# Patient Record
Sex: Female | Born: 1965 | Race: Black or African American | Hispanic: No | Smoking: Never smoker
Health system: Southern US, Community
[De-identification: ages and names within clinical notes are randomized; demographics above are authoritative.]

## PROBLEM LIST (undated history)

## (undated) DIAGNOSIS — D649 Anemia, unspecified: Secondary | ICD-10-CM

## (undated) DIAGNOSIS — R011 Cardiac murmur, unspecified: Secondary | ICD-10-CM

## (undated) DIAGNOSIS — E119 Type 2 diabetes mellitus without complications: Secondary | ICD-10-CM

## (undated) DIAGNOSIS — I10 Essential (primary) hypertension: Secondary | ICD-10-CM

## (undated) DIAGNOSIS — M1990 Unspecified osteoarthritis, unspecified site: Secondary | ICD-10-CM

## (undated) DIAGNOSIS — Z5189 Encounter for other specified aftercare: Secondary | ICD-10-CM

## (undated) HISTORY — DX: Type 2 diabetes mellitus without complications: E11.9

## (undated) HISTORY — DX: Encounter for other specified aftercare: Z51.89

## (undated) HISTORY — PX: BUNIONECTOMY: SHX129

## (undated) HISTORY — PX: OTHER SURGICAL HISTORY: SHX169

## (undated) HISTORY — DX: Anemia, unspecified: D64.9

## (undated) HISTORY — PX: OSTEOTOMY: SHX137

## (undated) HISTORY — DX: Essential (primary) hypertension: I10

## (undated) HISTORY — DX: Cardiac murmur, unspecified: R01.1

## (undated) HISTORY — DX: Unspecified osteoarthritis, unspecified site: M19.90

---

## 2013-10-15 HISTORY — PX: ELBOW SURGERY: SHX618

## 2017-06-29 ENCOUNTER — Encounter: Payer: Self-pay | Admitting: Gastroenterology

## 2017-07-10 ENCOUNTER — Encounter: Payer: Self-pay | Admitting: Gastroenterology

## 2017-07-18 ENCOUNTER — Other Ambulatory Visit: Payer: Self-pay | Admitting: Specialist

## 2017-07-18 DIAGNOSIS — Z1231 Encounter for screening mammogram for malignant neoplasm of breast: Secondary | ICD-10-CM

## 2017-07-24 ENCOUNTER — Ambulatory Visit
Admission: RE | Admit: 2017-07-24 | Discharge: 2017-07-24 | Disposition: A | Discharge status: Home / Self Care | Payer: BLUE CROSS/BLUE SHIELD | Source: Ambulatory Visit | Attending: Specialist | Admitting: Specialist

## 2017-07-24 DIAGNOSIS — Z1231 Encounter for screening mammogram for malignant neoplasm of breast: Secondary | ICD-10-CM

## 2017-08-06 ENCOUNTER — Telehealth: Payer: Self-pay | Admitting: Gastroenterology

## 2017-08-06 ENCOUNTER — Ambulatory Visit (AMBULATORY_SURGERY_CENTER): Payer: Self-pay

## 2017-08-06 ENCOUNTER — Other Ambulatory Visit: Payer: Self-pay

## 2017-08-06 ENCOUNTER — Encounter: Payer: Self-pay | Admitting: Gastroenterology

## 2017-08-06 VITALS — Ht 63.0 in | Wt 189.8 lb

## 2017-08-06 DIAGNOSIS — Z1211 Encounter for screening for malignant neoplasm of colon: Secondary | ICD-10-CM

## 2017-08-06 MED ORDER — NA SULFATE-K SULFATE-MG SULF 17.5-3.13-1.6 GM/177ML PO SOLN: 1.0000 | Freq: Once | ORAL | 0 refills | Status: DC

## 2017-08-06 MED ORDER — NA SULFATE-K SULFATE-MG SULF 17.5-3.13-1.6 GM/177ML PO SOLN: ORAL | 0 refills | Status: DC

## 2017-08-06 NOTE — Telephone Encounter (Signed)
Suprep resent to [employer]. Pt notified.

## 2017-08-06 NOTE — Progress Notes (Signed)
No egg or soy allergy known to patient  No issues with past sedation with any surgeries  or procedures, no intubation problems  No diet pills per patient No home 02 use per patient  No blood thinners per patient  Pt denies issues with constipation  No A fib or A flutter  EMMI video sent to pt's e mail , sent video

## 2017-08-08 ENCOUNTER — Ambulatory Visit: Payer: Self-pay

## 2017-08-16 ENCOUNTER — Encounter: Payer: Self-pay | Admitting: Gastroenterology

## 2017-08-20 ENCOUNTER — Other Ambulatory Visit: Payer: Self-pay

## 2017-08-20 ENCOUNTER — Ambulatory Visit (AMBULATORY_SURGERY_CENTER): Payer: BLUE CROSS/BLUE SHIELD | Admitting: Gastroenterology

## 2017-08-20 ENCOUNTER — Encounter: Payer: Self-pay | Admitting: Gastroenterology

## 2017-08-20 VITALS — BP 101/59 | HR 61 | Temp 98.2°F | Resp 11 | Ht 63.0 in | Wt 189.0 lb

## 2017-08-20 DIAGNOSIS — K621 Rectal polyp: Secondary | ICD-10-CM | POA: Diagnosis not present

## 2017-08-20 DIAGNOSIS — D129 Benign neoplasm of anus and anal canal: Secondary | ICD-10-CM

## 2017-08-20 DIAGNOSIS — Z1212 Encounter for screening for malignant neoplasm of rectum: Secondary | ICD-10-CM | POA: Diagnosis not present

## 2017-08-20 DIAGNOSIS — D128 Benign neoplasm of rectum: Secondary | ICD-10-CM

## 2017-08-20 DIAGNOSIS — Z1211 Encounter for screening for malignant neoplasm of colon: Secondary | ICD-10-CM

## 2017-08-20 MED ORDER — SODIUM CHLORIDE 0.9 % IV SOLN: 500.0000 mL | Freq: Once | INTRAVENOUS | Status: DC

## 2017-08-20 NOTE — Patient Instructions (Signed)
**   Handouts given on polyps and diverticulosis **   YOU HAD AN ENDOSCOPIC PROCEDURE TODAY AT THE [facility]:   Refer to the procedure report that was given to you for any specific questions about what was found during the examination.  If the procedure report does not answer your questions, please call your gastroenterologist to clarify.  If you requested that your care partner not be given the details of your procedure findings, then the procedure report has been included in a sealed envelope for you to review at your convenience later.  YOU SHOULD EXPECT: Some feelings of bloating in the abdomen. Passage of more gas than usual.  Walking can help get rid of the air that was put into your GI tract during the procedure and reduce the bloating. If you had a lower endoscopy (such as a colonoscopy or flexible sigmoidoscopy) you may notice spotting of blood in your stool or on the toilet paper. If you underwent a bowel prep for your procedure, you may not have a normal bowel movement for a few days.  Please Note:  You might notice some irritation and congestion in your nose or some drainage.  This is from the oxygen used during your procedure.  There is no need for concern and it should clear up in a day or so.  SYMPTOMS TO REPORT IMMEDIATELY:   Following lower endoscopy (colonoscopy or flexible sigmoidoscopy):  Excessive amounts of blood in the stool  Significant tenderness or worsening of abdominal pains  Swelling of the abdomen that is new, acute  Fever of 100F or higher  For urgent or emergent issues, a gastroenterologist can be reached at any hour by calling [phone].   DIET:  We do recommend a small meal at first, but then you may proceed to your regular diet.  Drink plenty of fluids but you should avoid alcoholic beverages for 24 hours.  ACTIVITY:  You should plan to take it easy for the rest of today and you should NOT DRIVE or use heavy machinery until tomorrow (because  of the sedation medicines used during the test).    FOLLOW UP: Our staff will call the number listed on your records the next business day following your procedure to check on you and address any questions or concerns that you may have regarding the information given to you following your procedure. If we do not reach you, we will leave a message.  However, if you are feeling well and you are not experiencing any problems, there is no need to return our call.  We will assume that you have returned to your regular daily activities without incident.  If any biopsies were taken you will be contacted by phone or by letter within the next 1-3 weeks.  Please call us at [phone] if you have not heard about the biopsies in 3 weeks.    SIGNATURES/CONFIDENTIALITY: You and/or your care partner have signed paperwork which will be entered into your electronic medical record.  These signatures attest to the fact that that the information above on your After Visit Summary has been reviewed and is understood.  Full responsibility of the confidentiality of this discharge information lies with you and/or your care-partner. 

## 2017-08-20 NOTE — Progress Notes (Signed)
To PACU VSS. Report to [email or web address] 

## 2017-08-20 NOTE — Op Note (Signed)
[facility] Patient Name: Belinda Chan Procedure Date: 08/20/2017 2:32 PM MRN: [id number] Endoscopist: Remo Lipps P. Arion Shankles MD, MD Age: 52 Referring MD:  Date of Birth: Oct 27, 1965 Gender: Female Account #: [id number] Procedure:                Colonoscopy Indications:              Screening for colorectal malignant neoplasm, This                            is the patient's first colonoscopy Medicines:                Monitored Anesthesia Care Procedure:                Pre-Anesthesia Assessment:                           - Prior to the procedure, a History and Physical                            was performed, and patient medications and                            allergies were reviewed. The patient's tolerance of                            previous anesthesia was also reviewed. The risks                            and benefits of the procedure and the sedation                            options and risks were discussed with the patient.                            All questions were answered, and informed consent                            was obtained. Prior Anticoagulants: The patient has                            taken no previous anticoagulant or antiplatelet                            agents. ASA Grade Assessment: II - A patient with                            mild systemic disease. After reviewing the risks                            and benefits, the patient was deemed in                            satisfactory condition to undergo the procedure.  After obtaining informed consent, the colonoscope                            was passed under direct vision. Throughout the                            procedure, the patient's blood pressure, pulse, and                            oxygen saturations were monitored continuously. The                            Model CF-HQ190L [id number] scope was introduced                            through the anus  and advanced to the the cecum,                            identified by appendiceal orifice and ileocecal                            valve. The colonoscopy was performed without                            difficulty. The patient tolerated the procedure                            well. The quality of the bowel preparation was                            good. The ileocecal valve, appendiceal orifice, and                            rectum were photographed. Scope In: 2:38:57 PM Scope Out: 2:54:43 PM Scope Withdrawal Time: 0 hours 12 minutes 46 seconds  Total Procedure Duration: 0 hours 15 minutes 46 seconds  Findings:                 The perianal and digital rectal examinations were                            normal.                           A 4 mm polyp was found in the rectum. The polyp was                            sessile. The polyp was removed with a cold snare.                            Resection and retrieval were complete.                           A few small-mouthed diverticula were found in the  sigmoid colon.                           The exam was otherwise without abnormality. Complications:            No immediate complications. Estimated blood loss:                            Minimal. Estimated Blood Loss:     Estimated blood loss was minimal. Impression:               - One 4 mm polyp in the rectum, removed with a cold                            snare. Resected and retrieved.                           - Diverticulosis in the sigmoid colon.                           - The examination was otherwise normal. Recommendation:           - Patient has a contact number available for                            emergencies. The signs and symptoms of potential                            delayed complications were discussed with the                            patient. Return to normal activities tomorrow.                            Written discharge  instructions were provided to the                            patient.                           - Resume previous diet.                           - Continue present medications.                           - Await pathology results.                           - Repeat colonoscopy date to be determined after                            pending pathology results are reviewed Remo Lipps P. Eilis Chestnutt MD, MD 08/20/2017 2:59:31 PM This report has been signed electronically.

## 2017-08-20 NOTE — Progress Notes (Signed)
Called to room to assist during endoscopic procedure.  Patient ID and intended procedure confirmed with present staff. Received instructions for my participation in the procedure from the performing physician.  

## 2017-08-20 NOTE — Progress Notes (Signed)
Pt's states no medical or surgical changes since previsit or office visit. 

## 2017-08-21 ENCOUNTER — Telehealth: Payer: Self-pay | Admitting: *Deleted

## 2017-08-21 NOTE — Telephone Encounter (Signed)
  Follow up Call-  Call back number 08/20/2017  Post procedure Call Back phone  # [phone]  Permission to leave phone message Yes     Patient questions:  Do you have a fever, pain , or abdominal swelling? No. Pain Score  0 *  Have you tolerated food without any problems? Yes.    Have you been able to return to your normal activities? Yes.    Do you have any questions about your discharge instructions: Diet   No. Medications  No. Follow up visit  No.  Do you have questions or concerns about your Care? No.  Actions: * If pain score is 4 or above: No action needed, pain <4.

## 2017-08-27 ENCOUNTER — Encounter: Payer: Self-pay | Admitting: Gastroenterology

## 2017-09-11 ENCOUNTER — Other Ambulatory Visit: Payer: Self-pay | Admitting: Orthopaedic Surgery

## 2017-09-11 DIAGNOSIS — M25511 Pain in right shoulder: Secondary | ICD-10-CM

## 2017-09-11 DIAGNOSIS — M25512 Pain in left shoulder: Principal | ICD-10-CM

## 2017-09-11 DIAGNOSIS — G8929 Other chronic pain: Secondary | ICD-10-CM

## 2017-09-15 ENCOUNTER — Ambulatory Visit
Admission: RE | Admit: 2017-09-15 | Discharge: 2017-09-15 | Disposition: A | Discharge status: Home / Self Care | Payer: BLUE CROSS/BLUE SHIELD | Source: Ambulatory Visit | Attending: Orthopaedic Surgery | Admitting: Orthopaedic Surgery

## 2017-09-15 DIAGNOSIS — M25512 Pain in left shoulder: Principal | ICD-10-CM

## 2017-09-15 DIAGNOSIS — G8929 Other chronic pain: Secondary | ICD-10-CM

## 2017-09-15 DIAGNOSIS — M25511 Pain in right shoulder: Principal | ICD-10-CM

## 2017-10-22 HISTORY — PX: ROTATOR CUFF REPAIR: SHX139

## 2018-02-08 ENCOUNTER — Encounter (HOSPITAL_BASED_OUTPATIENT_CLINIC_OR_DEPARTMENT_OTHER): Payer: Self-pay | Admitting: *Deleted

## 2018-02-08 ENCOUNTER — Other Ambulatory Visit: Payer: Self-pay

## 2018-02-11 ENCOUNTER — Other Ambulatory Visit: Payer: Self-pay

## 2018-02-11 ENCOUNTER — Encounter (HOSPITAL_BASED_OUTPATIENT_CLINIC_OR_DEPARTMENT_OTHER)
Admission: RE | Admit: 2018-02-11 | Discharge: 2018-02-11 | Disposition: A | Discharge status: Home / Self Care | Payer: BLUE CROSS/BLUE SHIELD | Source: Ambulatory Visit | Attending: Orthopaedic Surgery | Admitting: Orthopaedic Surgery

## 2018-02-11 DIAGNOSIS — M19011 Primary osteoarthritis, right shoulder: Secondary | ICD-10-CM | POA: Diagnosis not present

## 2018-02-11 DIAGNOSIS — Z79899 Other long term (current) drug therapy: Secondary | ICD-10-CM | POA: Diagnosis not present

## 2018-02-11 DIAGNOSIS — M19072 Primary osteoarthritis, left ankle and foot: Secondary | ICD-10-CM | POA: Diagnosis not present

## 2018-02-11 DIAGNOSIS — I10 Essential (primary) hypertension: Secondary | ICD-10-CM | POA: Diagnosis not present

## 2018-02-11 DIAGNOSIS — M19071 Primary osteoarthritis, right ankle and foot: Secondary | ICD-10-CM | POA: Diagnosis not present

## 2018-02-11 DIAGNOSIS — Z90710 Acquired absence of both cervix and uterus: Secondary | ICD-10-CM | POA: Diagnosis not present

## 2018-02-11 DIAGNOSIS — M7521 Bicipital tendinitis, right shoulder: Secondary | ICD-10-CM | POA: Diagnosis not present

## 2018-02-11 DIAGNOSIS — Z01810 Encounter for preprocedural cardiovascular examination: Secondary | ICD-10-CM | POA: Insufficient documentation

## 2018-02-11 DIAGNOSIS — Z791 Long term (current) use of non-steroidal anti-inflammatories (NSAID): Secondary | ICD-10-CM | POA: Diagnosis not present

## 2018-02-11 DIAGNOSIS — M479 Spondylosis, unspecified: Secondary | ICD-10-CM | POA: Diagnosis not present

## 2018-02-11 LAB — BASIC METABOLIC PANEL
Anion gap: 8 (ref 5–15)
BUN: 10 mg/dL (ref 6–20)
CALCIUM: 9.3 mg/dL (ref 8.9–10.3)
CO2: 29 mmol/L (ref 22–32)
CREATININE: 0.68 mg/dL (ref 0.44–1.00)
Chloride: 103 mmol/L (ref 98–111)
GFR calc non Af Amer: >60 mL/min (ref >60)
Glucose, Bld: 125 mg/dL — ABNORMAL HIGH (ref 70–99)
Potassium: 3.3 mmol/L — ABNORMAL LOW (ref 3.5–5.1)
SODIUM: 140 mmol/L (ref 135–145)

## 2018-02-11 NOTE — Progress Notes (Signed)
EKG reviewed with Dr Christella Hartigan. OK to proceed with surgery as scheduled.

## 2018-02-11 NOTE — Progress Notes (Signed)
Lab results reviewed by Dr. Kerin Perna, will proceed with surgery as scheduled.

## 2018-02-14 ENCOUNTER — Ambulatory Visit (HOSPITAL_COMMUNITY): Payer: BLUE CROSS/BLUE SHIELD

## 2018-02-14 ENCOUNTER — Ambulatory Visit (HOSPITAL_BASED_OUTPATIENT_CLINIC_OR_DEPARTMENT_OTHER): Payer: BLUE CROSS/BLUE SHIELD | Admitting: Anesthesiology

## 2018-02-14 ENCOUNTER — Encounter (HOSPITAL_BASED_OUTPATIENT_CLINIC_OR_DEPARTMENT_OTHER): Payer: Self-pay | Admitting: *Deleted

## 2018-02-14 ENCOUNTER — Other Ambulatory Visit: Payer: Self-pay

## 2018-02-14 ENCOUNTER — Encounter (HOSPITAL_BASED_OUTPATIENT_CLINIC_OR_DEPARTMENT_OTHER)
Admission: RE | Disposition: A | Discharge status: Home / Self Care | Payer: Self-pay | Source: Ambulatory Visit | Attending: Orthopaedic Surgery

## 2018-02-14 ENCOUNTER — Ambulatory Visit (HOSPITAL_BASED_OUTPATIENT_CLINIC_OR_DEPARTMENT_OTHER)
Admission: RE | Admit: 2018-02-14 | Discharge: 2018-02-14 | Disposition: A | Discharge status: Home / Self Care | Payer: BLUE CROSS/BLUE SHIELD | Source: Ambulatory Visit | Attending: Orthopaedic Surgery | Admitting: Orthopaedic Surgery

## 2018-02-14 DIAGNOSIS — Z419 Encounter for procedure for purposes other than remedying health state, unspecified: Secondary | ICD-10-CM

## 2018-02-14 DIAGNOSIS — Z90710 Acquired absence of both cervix and uterus: Secondary | ICD-10-CM | POA: Insufficient documentation

## 2018-02-14 DIAGNOSIS — M479 Spondylosis, unspecified: Secondary | ICD-10-CM | POA: Insufficient documentation

## 2018-02-14 DIAGNOSIS — M19011 Primary osteoarthritis, right shoulder: Secondary | ICD-10-CM | POA: Diagnosis not present

## 2018-02-14 DIAGNOSIS — M19072 Primary osteoarthritis, left ankle and foot: Secondary | ICD-10-CM | POA: Insufficient documentation

## 2018-02-14 DIAGNOSIS — I10 Essential (primary) hypertension: Secondary | ICD-10-CM | POA: Insufficient documentation

## 2018-02-14 DIAGNOSIS — M19071 Primary osteoarthritis, right ankle and foot: Secondary | ICD-10-CM | POA: Insufficient documentation

## 2018-02-14 DIAGNOSIS — Z79899 Other long term (current) drug therapy: Secondary | ICD-10-CM | POA: Insufficient documentation

## 2018-02-14 DIAGNOSIS — Z791 Long term (current) use of non-steroidal anti-inflammatories (NSAID): Secondary | ICD-10-CM | POA: Insufficient documentation

## 2018-02-14 DIAGNOSIS — M7521 Bicipital tendinitis, right shoulder: Secondary | ICD-10-CM | POA: Insufficient documentation

## 2018-02-14 HISTORY — PX: SHOULDER ARTHROSCOPY WITH BICEPS TENDON REPAIR: SHX5674

## 2018-02-14 SURGERY — SHOULDER ARTHROSCOPY WITH SUBACROMIAL DECOMPRESSION AND DISTAL CLAVICLE EXCISION
Anesthesia: General | Site: Shoulder | Laterality: Right

## 2018-02-14 MED ORDER — METOCLOPRAMIDE HCL 5 MG/ML IJ SOLN: 10.0000 mg | Freq: Once | INTRAMUSCULAR | Status: DC | PRN

## 2018-02-14 MED ORDER — PHENYLEPHRINE HCL 10 MG/ML IJ SOLN
INTRAMUSCULAR | Status: DC | PRN
  Administered 2018-02-14: 80 ug via INTRAVENOUS

## 2018-02-14 MED ORDER — FENTANYL CITRATE (PF) 100 MCG/2ML IJ SOLN: 25.0000 ug | INTRAMUSCULAR | Status: DC | PRN

## 2018-02-14 MED ORDER — BUPIVACAINE LIPOSOME 1.3 % IJ SUSP
INTRAMUSCULAR | Status: DC | PRN
  Administered 2018-02-14: 10 mL via PERINEURAL

## 2018-02-14 MED ORDER — PROPOFOL 10 MG/ML IV BOLUS
INTRAVENOUS | Status: DC | PRN
  Administered 2018-02-14: 200 mg via INTRAVENOUS

## 2018-02-14 MED ORDER — CEFAZOLIN SODIUM-DEXTROSE 2-4 GM/100ML-% IV SOLN
2.0000 g | INTRAVENOUS | Status: AC
  Administered 2018-02-14: 2 g via INTRAVENOUS

## 2018-02-14 MED ORDER — ACETAMINOPHEN 500 MG PO TABS: 1000.0000 mg | ORAL_TABLET | Freq: Three times a day (TID) | ORAL | 0 refills | Status: AC

## 2018-02-14 MED ORDER — HYDROCODONE-ACETAMINOPHEN 7.5-325 MG PO TABS: 1.0000 | ORAL_TABLET | Freq: Once | ORAL | Status: DC | PRN

## 2018-02-14 MED ORDER — DEXAMETHASONE SODIUM PHOSPHATE 10 MG/ML IJ SOLN
INTRAMUSCULAR | Status: AC
  Filled 2018-02-14: qty 1

## 2018-02-14 MED ORDER — MIDAZOLAM HCL 2 MG/2ML IJ SOLN
1.0000 mg | INTRAMUSCULAR | Status: DC | PRN
  Administered 2018-02-14: 2 mg via INTRAVENOUS

## 2018-02-14 MED ORDER — ONDANSETRON HCL 4 MG/2ML IJ SOLN
INTRAMUSCULAR | Status: DC | PRN
  Administered 2018-02-14: 4 mg via INTRAVENOUS

## 2018-02-14 MED ORDER — ONDANSETRON HCL 4 MG PO TABS: 4.0000 mg | ORAL_TABLET | Freq: Three times a day (TID) | ORAL | 1 refills | Status: AC | PRN

## 2018-02-14 MED ORDER — LACTATED RINGERS IV SOLN
INTRAVENOUS | Status: DC
  Administered 2018-02-14 (×2): via INTRAVENOUS

## 2018-02-14 MED ORDER — SCOPOLAMINE 1 MG/3DAYS TD PT72: 1.0000 | MEDICATED_PATCH | Freq: Once | TRANSDERMAL | Status: DC | PRN

## 2018-02-14 MED ORDER — LIDOCAINE 2% (20 MG/ML) 5 ML SYRINGE
INTRAMUSCULAR | Status: AC
  Filled 2018-02-14: qty 5

## 2018-02-14 MED ORDER — BUPIVACAINE-EPINEPHRINE (PF) 0.5% -1:200000 IJ SOLN
INTRAMUSCULAR | Status: DC | PRN
  Administered 2018-02-14: 15 mL via PERINEURAL

## 2018-02-14 MED ORDER — FENTANYL CITRATE (PF) 100 MCG/2ML IJ SOLN
INTRAMUSCULAR | Status: AC
  Filled 2018-02-14: qty 2

## 2018-02-14 MED ORDER — SODIUM CHLORIDE 0.9 % IR SOLN
Status: DC | PRN
  Administered 2018-02-14: 1000 mL

## 2018-02-14 MED ORDER — PHENYLEPHRINE 40 MCG/ML (10ML) SYRINGE FOR IV PUSH (FOR BLOOD PRESSURE SUPPORT)
PREFILLED_SYRINGE | INTRAVENOUS | Status: AC
  Filled 2018-02-14: qty 10

## 2018-02-14 MED ORDER — DEXAMETHASONE SODIUM PHOSPHATE 4 MG/ML IJ SOLN
INTRAMUSCULAR | Status: DC | PRN
  Administered 2018-02-14: 10 mg via INTRAVENOUS

## 2018-02-14 MED ORDER — ONDANSETRON HCL 4 MG/2ML IJ SOLN
INTRAMUSCULAR | Status: AC
  Filled 2018-02-14: qty 2

## 2018-02-14 MED ORDER — OXYCODONE HCL 5 MG PO TABS: ORAL_TABLET | ORAL | 0 refills | Status: AC

## 2018-02-14 MED ORDER — FENTANYL CITRATE (PF) 100 MCG/2ML IJ SOLN
50.0000 ug | INTRAMUSCULAR | Status: DC | PRN
  Administered 2018-02-14: 50 ug via INTRAVENOUS

## 2018-02-14 MED ORDER — MELOXICAM 7.5 MG PO TABS: 7.5000 mg | ORAL_TABLET | Freq: Every day | ORAL | 2 refills | Status: AC

## 2018-02-14 MED ORDER — CEFAZOLIN SODIUM-DEXTROSE 2-4 GM/100ML-% IV SOLN
INTRAVENOUS | Status: AC
  Filled 2018-02-14: qty 100

## 2018-02-14 MED ORDER — SODIUM CHLORIDE 0.9 % IR SOLN
Status: DC | PRN
  Administered 2018-02-14: 12000 mL

## 2018-02-14 MED ORDER — LIDOCAINE 2% (20 MG/ML) 5 ML SYRINGE
INTRAMUSCULAR | Status: DC | PRN
  Administered 2018-02-14: 50 mg via INTRAVENOUS

## 2018-02-14 MED ORDER — MIDAZOLAM HCL 2 MG/2ML IJ SOLN
INTRAMUSCULAR | Status: AC
  Filled 2018-02-14: qty 2

## 2018-02-14 MED ORDER — CHLORHEXIDINE GLUCONATE 4 % EX LIQD: 60.0000 mL | Freq: Once | CUTANEOUS | Status: DC

## 2018-02-14 MED ORDER — MEPERIDINE HCL 25 MG/ML IJ SOLN: 6.2500 mg | INTRAMUSCULAR | Status: DC | PRN

## 2018-02-14 SURGICAL SUPPLY — 80 items
ANCHOR SUT FBRTK 2 TIGTAIL (Anchor) ×4 IMPLANT
ANCHOR SUT FBRTK 2 WIRE (Anchor) ×4 IMPLANT
BLADE EXCALIBUR 4.0MM X 13CM (MISCELLANEOUS) ×1
BLADE EXCALIBUR 4.0X13 (MISCELLANEOUS) ×3 IMPLANT
BLADE SHAVER BONE 5.0MM X 13CM (MISCELLANEOUS)
BLADE SHAVER BONE 5.0X13 (MISCELLANEOUS) IMPLANT
BNDG COHESIVE 4X5 TAN STRL (GAUZE/BANDAGES/DRESSINGS) ×4 IMPLANT
BURR OVAL 8 FLU 4.0MM X 13CM (MISCELLANEOUS) ×1
BURR OVAL 8 FLU 4.0X13 (MISCELLANEOUS) ×3 IMPLANT
CANNULA 5.75X71 LONG (CANNULA) IMPLANT
CANNULA PASSPORT 5 (CANNULA) IMPLANT
CANNULA PASSPORT 5CM (CANNULA)
CANNULA PASSPORT BUTTON 10-40 (CANNULA) ×4 IMPLANT
CANNULA TWIST IN 8.25X7CM (CANNULA) IMPLANT
CHLORAPREP W/TINT 26ML (MISCELLANEOUS) ×4 IMPLANT
CLOSURE WOUND 1/2 X4 (GAUZE/BANDAGES/DRESSINGS)
COVER WAND RF STERILE (DRAPES) IMPLANT
DECANTER SPIKE VIAL GLASS SM (MISCELLANEOUS) IMPLANT
DISSECTOR 3.5MM X 13CM CVD (MISCELLANEOUS) ×4 IMPLANT
DISSECTOR 4.0MMX13CM CVD (MISCELLANEOUS) IMPLANT
DRAPE IMP U-DRAPE 54X76 (DRAPES) ×4 IMPLANT
DRAPE INCISE IOBAN 66X45 STRL (DRAPES) IMPLANT
DRAPE SHOULDER BEACH CHAIR (DRAPES) ×8 IMPLANT
DRSG PAD ABDOMINAL 8X10 ST (GAUZE/BANDAGES/DRESSINGS) ×4 IMPLANT
DW OUTFLOW CASSETTE/TUBE SET (MISCELLANEOUS) IMPLANT
ELECT NEEDLE TIP 2.8 STRL (NEEDLE) IMPLANT
ELECT REM PT RETURN 9FT ADLT (ELECTROSURGICAL)
ELECTRODE REM PT RTRN 9FT ADLT (ELECTROSURGICAL) IMPLANT
GAUZE SPONGE 4X4 12PLY STRL (GAUZE/BANDAGES/DRESSINGS) ×4 IMPLANT
GAUZE XEROFORM 1X8 LF (GAUZE/BANDAGES/DRESSINGS) IMPLANT
GLOVE BIOGEL PI IND STRL 7.0 (GLOVE) ×4 IMPLANT
GLOVE BIOGEL PI IND STRL 8 (GLOVE) ×2 IMPLANT
GLOVE BIOGEL PI INDICATOR 7.0 (GLOVE) ×4
GLOVE BIOGEL PI INDICATOR 8 (GLOVE) ×2
GLOVE ECLIPSE 6.5 STRL STRAW (GLOVE) ×4 IMPLANT
GLOVE ECLIPSE 8.0 STRL XLNG CF (GLOVE) ×8 IMPLANT
GOWN STRL REUS W/ TWL LRG LVL3 (GOWN DISPOSABLE) ×2 IMPLANT
GOWN STRL REUS W/TWL LRG LVL3 (GOWN DISPOSABLE) ×2
GOWN STRL REUS W/TWL XL LVL3 (GOWN DISPOSABLE) ×4 IMPLANT
IV NS IRRIG 3000ML ARTHROMATIC (IV SOLUTION) ×20 IMPLANT
KIT SPEAR STR 1.6MM DRILL (MISCELLANEOUS) ×4 IMPLANT
KIT STABILIZATION SHOULDER (MISCELLANEOUS) ×4 IMPLANT
KIT STR SPEAR 1.8 FBRTK DISP (KITS) IMPLANT
LASSO 90 CVE QUICKPAS (DISPOSABLE) IMPLANT
MANIFOLD NEPTUNE II (INSTRUMENTS) ×4 IMPLANT
NDL SAFETY ECLIPSE 18X1.5 (NEEDLE) ×2 IMPLANT
NDL SUT 6 .5 CRC .975X.05 MAYO (NEEDLE) IMPLANT
NEEDLE HYPO 18GX1.5 SHARP (NEEDLE) ×2
NEEDLE MAYO TAPER (NEEDLE)
NEEDLE SCORPION MULTI FIRE (NEEDLE) IMPLANT
PACK ARTHROSCOPY DSU (CUSTOM PROCEDURE TRAY) ×4 IMPLANT
PACK BASIN DAY SURGERY FS (CUSTOM PROCEDURE TRAY) ×4 IMPLANT
PENCIL BUTTON HOLSTER BLD 10FT (ELECTRODE) IMPLANT
PORT APPOLLO RF 90DEGREE MULTI (SURGICAL WAND) ×4 IMPLANT
PROBE BIPOLAR ATHRO 135MM 90D (MISCELLANEOUS) IMPLANT
RESTRAINT HEAD UNIVERSAL NS (MISCELLANEOUS) ×4 IMPLANT
SHEET MEDIUM DRAPE 40X70 STRL (DRAPES) IMPLANT
SLEEVE SCD COMPRESS KNEE MED (MISCELLANEOUS) ×4 IMPLANT
SLING ARM FOAM STRAP LRG (SOFTGOODS) ×4 IMPLANT
SLING ARM IMMOBILIZER LRG (SOFTGOODS) IMPLANT
SLING ARM IMMOBILIZER MED (SOFTGOODS) IMPLANT
SLING ARM MED ADULT FOAM STRAP (SOFTGOODS) IMPLANT
SLING ARM XL FOAM STRAP (SOFTGOODS) IMPLANT
SPONGE LAP 4X18 RFD (DISPOSABLE) IMPLANT
STRIP CLOSURE SKIN 1/2X4 (GAUZE/BANDAGES/DRESSINGS) IMPLANT
SUCTION FRAZIER HANDLE 10FR (MISCELLANEOUS)
SUCTION TUBE FRAZIER 10FR DISP (MISCELLANEOUS) IMPLANT
SUT FIBERWIRE #2 38 T-5 BLUE (SUTURE)
SUT TIGER TAPE 7 IN WHITE (SUTURE) IMPLANT
SUTURE FIBERWR #2 38 T-5 BLUE (SUTURE) IMPLANT
SUTURE TAPE TIGERLINK 1.3MM BL (SUTURE) IMPLANT
SUTURETAPE TIGERLINK 1.3MM BL (SUTURE)
SYR 5ML LUER SLIP (SYRINGE) ×4 IMPLANT
TAPE FIBER 2MM 7IN #2 BLUE (SUTURE) IMPLANT
TOWEL GREEN STERILE FF (TOWEL DISPOSABLE) ×4 IMPLANT
TOWEL OR NON WOVEN STRL DISP B (DISPOSABLE) ×4 IMPLANT
TUBE CONNECTING 20'X1/4 (TUBING) ×2
TUBE CONNECTING 20X1/4 (TUBING) ×6 IMPLANT
TUBE SUCTION HIGH CAP CLEAR NV (SUCTIONS) IMPLANT
TUBING ARTHROSCOPY IRRIG 16FT (MISCELLANEOUS) ×4 IMPLANT

## 2018-02-14 NOTE — Discharge Instructions (Signed)
Post Anesthesia Home Care Instructions  Activity: Get plenty of rest for the remainder of the day. A responsible individual must stay with you for 24 hours following the procedure.  For the next 24 hours, DO NOT: -Drive a car -Operate machinery -Drink alcoholic beverages -Take any medication unless instructed by your physician -Make any legal decisions or sign important papers.  Meals: Start with liquid foods such as gelatin or soup. Progress to regular foods as tolerated. Avoid greasy, spicy, heavy foods. If nausea and/or vomiting occur, drink only clear liquids until the nausea and/or vomiting subsides. Call your physician if vomiting continues.  Special Instructions/Symptoms: Your throat may feel dry or sore from the anesthesia or the breathing tube placed in your throat during surgery. If this causes discomfort, gargle with warm salt water. The discomfort should disappear within 24 hours.  If you had a scopolamine patch placed behind your ear for the management of post- operative nausea and/or vomiting:  1. The medication in the patch is effective for 72 hours, after which it should be removed.  Wrap patch in a tissue and discard in the trash. Wash hands thoroughly with soap and water. 2. You may remove the patch earlier than 72 hours if you experience unpleasant side effects which may include dry mouth, dizziness or visual disturbances. 3. Avoid touching the patch. Wash your hands with soap and water after contact with the patch.      Regional Anesthesia Blocks  1. Numbness or the inability to move the "blocked" extremity may last from 3-48 hours after placement. The length of time depends on the medication injected and your individual response to the medication. If the numbness is not going away after 48 hours, call your surgeon.  2. The extremity that is blocked will need to be protected until the numbness is gone and the  Strength has returned. Because you cannot feel it, you  will need to take extra care to avoid injury. Because it may be weak, you may have difficulty moving it or using it. You may not know what position it is in without looking at it while the block is in effect.  3. For blocks in the legs and feet, returning to weight bearing and walking needs to be done carefully. You will need to wait until the numbness is entirely gone and the strength has returned. You should be able to move your leg and foot normally before you try and bear weight or walk. You will need someone to be with you when you first try to ensure you do not fall and possibly risk injury.  4. Bruising and tenderness at the needle site are common side effects and will resolve in a few days.  5. Persistent numbness or new problems with movement should be communicated to the surgeon or the [facility] ([phone])/ [facility] ([phone]).  Information for Discharge Teaching: EXPAREL (bupivacaine liposome injectable suspension)   Your surgeon or anesthesiologist gave you EXPAREL(bupivacaine) to help control your pain after surgery.   EXPAREL is a local anesthetic that provides pain relief by numbing the tissue around the surgical site.  EXPAREL is designed to release pain medication over time and can control pain for up to 72 hours.  Depending on how you respond to EXPAREL, you may require less pain medication during your recovery.  Possible side effects:  Temporary loss of sensation or ability to move in the area where bupivacaine was injected.  Nausea, vomiting, constipation  Rarely,   numbness and tingling in your mouth or lips, lightheadedness, or anxiety may occur.  Call your doctor right away if you think you may be experiencing any of these sensations, or if you have other questions regarding possible side effects.  Follow all other discharge instructions given to you by your surgeon or nurse. Eat a healthy diet and drink plenty of water or other  fluids.  If you return to the hospital for any reason within 96 hours following the administration of EXPAREL, it is important for health care providers to know that you have received this anesthetic. A teal colored band has been placed on your arm with the date, time and amount of EXPAREL you have received in order to alert and inform your health care providers. Please leave this armband in place for the full 96 hours following administration, and then you may remove the band. 

## 2018-02-14 NOTE — Progress Notes (Signed)
Assisted Dr. Royce Macadamia with right, ultrasound guided, interscalene  block. Side rails up, monitors on throughout procedure. See vital signs in flow sheet. Tolerated Procedure well.

## 2018-02-14 NOTE — Anesthesia Procedure Notes (Signed)
Procedure Name: LMA Insertion Performed by: Nadezhda Pollitt W, CRNA Pre-anesthesia Checklist: Patient identified, Emergency Drugs available, Suction available and Patient being monitored Patient Re-evaluated:Patient Re-evaluated prior to induction Oxygen Delivery Method: Circle system utilized Preoxygenation: Pre-oxygenation with 100% oxygen Induction Type: IV induction Ventilation: Mask ventilation without difficulty LMA: LMA inserted LMA Size: 4.0 Number of attempts: 1 Placement Confirmation: positive ETCO2 Tube secured with: Tape Dental Injury: Teeth and Oropharynx as per pre-operative assessment        

## 2018-02-14 NOTE — Anesthesia Procedure Notes (Signed)
Anesthesia Regional Block: Interscalene brachial plexus block   Pre-Anesthetic Checklist: ,, timeout performed, Correct Patient, Correct Site, Correct Laterality, Correct Procedure, Correct Position, site marked, Risks and benefits discussed,  Surgical consent,  Pre-op evaluation,  At surgeon's request and post-op pain management  Laterality: Right  Prep: chloraprep       Needles:  Injection technique: Single-shot  Needle Type: Echogenic Stimulator Needle     Needle Length: 9cm  Needle Gauge: 21   Needle insertion depth: 5 cm   Additional Needles:   Procedures:, nerve stimulator,,, ultrasound used (permanent image in chart),,,,  Motor weakness within 9 minutes.  Narrative:  Start time: 02/14/2018 7:45 AM End time: 02/14/2018 7:50 AM Injection made incrementally with aspirations every 5 mL.  Performed by: Personally  Anesthesiologist: Josephine Igo, MD  Additional Notes: Timeout performed. Patient sedated. Relevant anatomy ID'd using [place]. Incremental 2-62ml injection of LA with frequent aspiration. Patient tolerated procedure well.        Right Interscalene Block

## 2018-02-14 NOTE — Op Note (Signed)
Orthopaedic Surgery Operative Note (CSN: [id number])  Belinda Chan  11-26-1965 Date of Surgery: 02/14/2018   Diagnoses:  OSTEOARTHRITIS, BICIPITAL TENDINITIS RIGHT SHOULDER  Procedure: Right shoulder extensive debridement Right biceps tenodesis Right subacromial decompression Right distal clavicle excision   Operative Finding Successful completion of planned procedure.   Exam under anesthesia: full motion, no instability Articular space: No loose bodies, capsule intact, posterior and anterior labral fraying debrided sharply Chondral surfaces:Intact, no sign of chondral degeneration on the glenoid or humeral head Biceps:  Slap variant with cartilage attached to anchor but anchor unstable Subscapularis: intat Superior Cuff:pristine Bursal side: pristine cuff, type 2 acromion converted to type 1, 46mm DC excision   Post-operative plan: The patient will be nonweightbearing in a sling.  The patient will be discharged home.  DVT prophylaxis not indicated in ambulatory upper extremity patient.  Pain control with PRN pain medication preferring oral medicines.  Follow up plan will be scheduled in approximately 7 days for incision check and XR.  Post-Op Diagnosis: Same Surgeons:Primary: Hiram Gash, MD Assistants: Location: [facility] ROOM 6 Anesthesia: Choice Antibiotics: Ancef 2g preop Tourniquet time: * No tourniquets in log * Estimated Blood Loss: Minimal Complications: None Specimens: None Implants: * No implants in log *  Indications for Surgery:   Belinda Chan is a 52 y.o. female with history of a left rotator cuff repair performed about 4 months ago she is done well from this and she had at the time of onset additionally right shoulder pain.  MRI demonstrated that she had no obvious rotator cuff tear on the right side however she had impingement symptoms and concern for bicipital pathology.  Patient understood that she was continuing to have pain and we talked about the fact  that she failed anti-inflammatories, therapy and injections.  Based on this she wanted to proceed with surgery as she was significantly debilitated in regards to her shoulder benefits and risks of operative and nonoperative management were discussed prior to surgery with patient/guardian(s) and informed consent form was completed.  Specific risks including infection, need for additional surgery, future cuff pathology, stiffness, need for further surgery and continued pain.   Procedure:   Patient was correctly identified in the preoperative holding area and operative site marked.  Patient brought to OR and positioned beachchair on an Sturgeon table ensuring that all bony prominences were padded and the head was in an appropriate location.  Anesthesia was induced and the operative shoulder was prepped and draped in the usual sterile fashion.  Timeout was called preincision.  A standard posterior viewing portal was made after localizing the portal with a spinal needle.  An anterior accessory portal was also made.  After clearing the articular space the camera was positioned in the subacromial space.  Findings above.  Labrum and biceps stump debrided sharply with shaver.   Subacromial decompression: We made a lateral portal with spinal needle guidance. We then proceeded to debride bursal tissue extensively with a shaver and arthrocare device. At that point we continued to identify the borders of the acromion and identify the spur. We then carefully preserved the deltoid fascia and used a burr to convert the type 2 acromion to a Type 1 flat acromion without issue.  Distal Clavicle resection:  The scope was placed in the subacromial space from the posterior portal.  A hemostat was placed through the anterior portal and we spread at the [facility] joint.  A burr was then inserted and 10 mm of distal clavicle was resected  taking care to avoid damage to the capsule around the joint and avoiding overhanging bone  posteriorly.    Biceps tenodesis: We marked the tendon and then performed a tenotomy and debridement of the stump in the articular space. We then identified the biceps tendon in its groove suprapec with the arthroscope in the lateral portal taking care to move from lateral to medial to avoid injury to the subscapularis. At that point we unroofed the tendon itself and mobilized it. An accessory anterior portal was made in line with the tendon and we grasped it from the anterior superior portal and worked from the accessory anterior portal. Two Fibertak 1.46mm anchors were placed in the groove and the tendon was secured in a luggage loop style fashion with good tension on the tendon.    The incisions were closed with absorbable monocryl, benzoin and steri strips.  A sterile dressing was placed along with a sling. The patient was awoken from general anesthesia and taken to the PACU in stable condition without complication.

## 2018-02-14 NOTE — H&P (Signed)
PREOPERATIVE H&P  Chief Complaint: OSTEOARTHRITIS, BICIPITAL TENDINITIS RIGHT SHOULDER  HPI: Belinda Chan is a 52 y.o. female who presents for preoperative history and physical with a diagnosis of OSTEOARTHRITIS, BICIPITAL TENDINITIS RIGHT SHOULDER. Symptoms are rated as moderate to severe, and have been worsening.  This is significantly impairing activities of daily living.  Please see my clinic note for full details on this patient's care.  She has elected for surgical management.   Past Medical History:  Diagnosis Date  . Anemia    in her 40's  . Arthritis    back,feet, right arm  . Blood transfusion without reported diagnosis    2010 hysterectomy  . Heart murmur   . Hypertension    Past Surgical History:  Procedure Laterality Date  . back fusion  2013 October  . BUNIONECTOMY     2010  . CESAREAN SECTION     1997  . ELBOW SURGERY  10/2013  . hystertectomy     2010  . OSTEOTOMY     right calcaeus removal of screws, ( minimal degeneraative disease @ first metatersophalangeal joint  . ROTATOR CUFF REPAIR Left 10/22/2017   Social History   Socioeconomic History  . Marital status: Divorced    Spouse name: Not on file  . Number of children: Not on file  . Years of education: Not on file  . Highest education level: Not on file  Occupational History  . Not on file  Social Needs  . Financial resource strain: Not on file  . Food insecurity:    Worry: Not on file    Inability: Not on file  . Transportation needs:    Medical: Not on file    Non-medical: Not on file  Tobacco Use  . Smoking status: Never Smoker  . Smokeless tobacco: Never Used  Substance and Sexual Activity  . Alcohol use: Never    Frequency: Never  . Drug use: Never  . Sexual activity: Not on file  Lifestyle  . Physical activity:    Days per week: Not on file    Minutes per session: Not on file  . Stress: Not on file  Relationships  . Social connections:    Talks on phone: Not on file    Gets  together: Not on file    Attends religious service: Not on file    Active member of club or organization: Not on file    Attends meetings of clubs or organizations: Not on file    Relationship status: Not on file  Other Topics Concern  . Not on file  Social History Narrative  . Not on file   Family History  Problem Relation Age of Onset  . Colon cancer Neg Hx   . Colon polyps Neg Hx   . Esophageal cancer Neg Hx   . Rectal cancer Neg Hx   . Stomach cancer Neg Hx    No Known Allergies Prior to Admission medications   Medication Sig Start Date End Date Taking? Authorizing Provider  amLODipine (NORVASC) 5 MG tablet Take 5 mg by mouth daily.  07/22/17  Yes [provider]  losartan-hydrochlorothiazide (HYZAAR) 100-25 MG tablet Take 1 tablet by mouth daily.  07/22/17  Yes [provider]  acetaminophen (TYLENOL) 500 MG tablet Take 2 tablets (1,000 mg total) by mouth every 8 (eight) hours for 14 days. 02/14/18 02/28/18  Hiram Gash, MD  meloxicam (MOBIC) 7.5 MG tablet Take 1 tablet (7.5 mg total) by mouth daily. 02/14/18 02/14/19  Hiram Gash, MD  ondansetron (ZOFRAN) 4 MG tablet Take 1 tablet (4 mg total) by mouth every 8 (eight) hours as needed for up to 7 days for nausea or vomiting. 02/14/18 02/21/18  Hiram Gash, MD  oxyCODONE (OXY IR/ROXICODONE) 5 MG immediate release tablet Take 1-2 pills every 6 hrs as needed for pain, no more than 6 per day 02/14/18 02/19/18  Hiram Gash, MD     Positive ROS: All other systems have been reviewed and were otherwise negative with the exception of those mentioned in the HPI and as above.  Physical Exam: General: Alert, no acute distress Cardiovascular: No pedal edema Respiratory: No cyanosis, no use of accessory musculature GI: No organomegaly, abdomen is soft and non-tender Skin: No lesions in the area of chief complaint Neurologic: Sensation intact distally Psychiatric: Patient is competent for consent with normal mood and  affect Lymphatic: No axillary or cervical lymphadenopathy  MUSCULOSKELETAL: R shoulder: pain with motion, +AC pain, +obrien, NVID  Assessment: OSTEOARTHRITIS, BICIPITAL TENDINITIS RIGHT SHOULDER  Plan: Plan for Procedure(s): SHOULDER ARTHROSCOPY WITH EXTENSIVE DEBRIDEMENT, DISTAL CLAVICULECTOMY, SUBACROMIAL DECOMPRESSION PARTIAL ACROMIOPLASTY, AND POSSIBLE BICEP TENDON REPAIR  The risks benefits and alternatives were discussed with the patient including but not limited to the risks of nonoperative treatment, versus surgical intervention including infection, bleeding, nerve injury,  blood clots, cardiopulmonary complications, morbidity, mortality, among others, and they were willing to proceed.   Hiram Gash, MD  02/14/2018 8:42 AM

## 2018-02-14 NOTE — Anesthesia Preprocedure Evaluation (Signed)
Anesthesia Evaluation  Patient identified by MRN, date of birth, ID band Patient awake    Reviewed: Allergy & Precautions, NPO status , Patient's Chart, lab work & pertinent test results  Airway Mallampati: II  TM Distance: >3 FB Neck ROM: Full    Dental no notable dental hx. (+) Teeth Intact   Pulmonary neg pulmonary ROS,    Pulmonary exam normal breath sounds clear to auscultation       Cardiovascular hypertension, Pt. on medications Normal cardiovascular exam Rhythm:Regular Rate:Normal     Neuro/Psych negative neurological ROS  negative psych ROS   GI/Hepatic negative GI ROS, Neg liver ROS,   Endo/Other  Obesity  Renal/GU negative Renal ROS  negative genitourinary   Musculoskeletal  (+) Arthritis , Osteoarthritis,  Bicipital tendinitis right shoulder   Abdominal (+) + obese,   Peds  Hematology  (+) anemia ,   Anesthesia Other Findings   Reproductive/Obstetrics                             Anesthesia Physical Anesthesia Plan  ASA: II  Anesthesia Plan: General   Post-op Pain Management:  Regional for Post-op pain   Induction: Intravenous  PONV Risk Score and Plan: 4 or greater and Ondansetron, Dexamethasone, Midazolam and Treatment may vary due to age or medical condition  Airway Management Planned: LMA and Oral ETT  Additional Equipment:   Intra-op Plan:   Post-operative Plan: Extubation in OR  Informed Consent: I have reviewed the patients History and Physical, chart, labs and discussed the procedure including the risks, benefits and alternatives for the proposed anesthesia with the patient or authorized representative who has indicated his/her understanding and acceptance.   Dental advisory given  Plan Discussed with: CRNA and Surgeon  Anesthesia Plan Comments:         Anesthesia Quick Evaluation

## 2018-02-14 NOTE — Anesthesia Postprocedure Evaluation (Signed)
Anesthesia Post Note  Patient: Belinda Chan  Procedure(s) Performed: SHOULDER ARTHROSCOPY WITH EXTENSIVE DEBRIDEMENT, DISTAL CLAVICULECTOMY, SUBACROMIAL DECOMPRESSION PARTIAL ACROMIOPLASTY, AND BICEPS TENODESIS (Right Shoulder) SHOULDER ARTHROSCOPY WITH BICEPS TENDON REPAIR (Right Shoulder)     Patient location during evaluation: PACU Anesthesia Type: General Level of consciousness: awake and alert and oriented Pain management: pain level controlled Vital Signs Assessment: post-procedure vital signs reviewed and stable Respiratory status: spontaneous breathing, nonlabored ventilation and respiratory function stable Cardiovascular status: blood pressure returned to baseline and stable Postop Assessment: no apparent nausea or vomiting Anesthetic complications: no    Last Vitals:  Vitals:   02/14/18 1045 02/14/18 1050  BP: 122/85   Pulse: 67 69  Resp: 14 14  Temp:    SpO2: 100% 100%    Last Pain:  Vitals:   02/14/18 1030  TempSrc:   PainSc: 0-No pain                 Estuardo Frisbee A.

## 2018-02-14 NOTE — Transfer of Care (Signed)
Immediate Anesthesia Transfer of Care Note  Patient: Belinda Chan  Procedure(s) Performed: SHOULDER ARTHROSCOPY WITH EXTENSIVE DEBRIDEMENT, DISTAL CLAVICULECTOMY, SUBACROMIAL DECOMPRESSION PARTIAL ACROMIOPLASTY, AND BICEPS TENODESIS (Right Shoulder) SHOULDER ARTHROSCOPY WITH BICEPS TENDON REPAIR (Right Shoulder)  Patient Location: PACU  Anesthesia Type:General and Regional  Level of Consciousness: awake and sedated  Airway & Oxygen Therapy: Patient Spontanous Breathing and Patient connected to face mask oxygen  Post-op Assessment: Report given to RN and Post -op Vital signs reviewed and stable  Post vital signs: Reviewed and stable  Last Vitals:  Vitals Value Taken Time  BP 127/87 02/14/2018 10:26 AM  Temp    Pulse 82 02/14/2018 10:27 AM  Resp 18 02/14/2018 10:27 AM  SpO2 100 % 02/14/2018 10:27 AM  Vitals shown include unvalidated device data.  Last Pain:  Vitals:   02/14/18 0719  TempSrc: Oral  PainSc: 0-No pain         Complications: No apparent anesthesia complications

## 2018-02-15 ENCOUNTER — Encounter (HOSPITAL_BASED_OUTPATIENT_CLINIC_OR_DEPARTMENT_OTHER): Payer: Self-pay | Admitting: Orthopaedic Surgery

## 2018-10-28 ENCOUNTER — Other Ambulatory Visit: Payer: Self-pay | Admitting: Specialist

## 2018-10-28 DIAGNOSIS — Z1231 Encounter for screening mammogram for malignant neoplasm of breast: Secondary | ICD-10-CM

## 2018-12-13 ENCOUNTER — Other Ambulatory Visit: Payer: Self-pay | Admitting: Physician Assistant

## 2018-12-13 ENCOUNTER — Ambulatory Visit
Admission: RE | Admit: 2018-12-13 | Discharge: 2018-12-13 | Disposition: A | Discharge status: Home / Self Care | Payer: BLUE CROSS/BLUE SHIELD | Source: Ambulatory Visit | Attending: Specialist | Admitting: Specialist

## 2018-12-13 ENCOUNTER — Other Ambulatory Visit: Payer: Self-pay

## 2018-12-13 DIAGNOSIS — Z1231 Encounter for screening mammogram for malignant neoplasm of breast: Secondary | ICD-10-CM

## 2019-01-30 ENCOUNTER — Other Ambulatory Visit: Payer: Self-pay

## 2019-01-30 ENCOUNTER — Encounter: Payer: 59 | Attending: Physician Assistant | Admitting: *Deleted

## 2019-01-30 DIAGNOSIS — IMO0002 Reserved for concepts with insufficient information to code with codable children: Secondary | ICD-10-CM

## 2019-01-30 DIAGNOSIS — E118 Type 2 diabetes mellitus with unspecified complications: Secondary | ICD-10-CM | POA: Insufficient documentation

## 2019-01-30 DIAGNOSIS — E1165 Type 2 diabetes mellitus with hyperglycemia: Secondary | ICD-10-CM

## 2019-01-30 NOTE — Patient Instructions (Signed)
Plan:  Aim for 3 Carb Choices per meal (45 grams) +/- 1 either way  Aim for 0-2 Carbs per snack if hungry  Include protein in moderation with your meals and snacks Consider reading food labels for Total Carbohydrate of foods Consider  increasing your activity level by walking, dancing or other activities for 30 minutes a few days a week as tolerated Continue checking BG at alternate times per day with Elenor Legato Continue taking medication as directed by MD

## 2019-01-30 NOTE — Progress Notes (Signed)
Diabetes Self-Management Education  Visit Type: First/Initial  Appt. Start Time: 1530 Appt. End Time: 1700  01/30/2019  [name], identified by name and date of birth, is a 53 y.o. female with a diagnosis of Diabetes: Type 2. Patient states newly diagnosed this past summer after sudden weight loss of 25 pounds, polyuria, polydipsia and blurred vision. She now has a Libre CGM that she is able to follow BG throughout the day. She states she is looking forward to understanding what diabetes is and how she can control it, as well as understanding the food guidelines.   ASSESSMENT  There were no vitals taken for this visit. There is no height or weight on file to calculate BMI.  Diabetes Self-Management Education - 01/30/19 1542      Visit Information   Visit Type  First/Initial      Initial Visit   Diabetes Type  Type 2    Are you currently following a meal plan?  No    Are you taking your medications as prescribed?  Yes    Date Diagnosed  09/2018      Health Coping   How would you rate your overall health?  Good      Psychosocial Assessment   Patient Belief/Attitude about Diabetes  Motivated to manage diabetes    Self-care barriers  None    Self-management support  Family    Other persons present  Patient    Patient Concerns  Nutrition/Meal planning;Medication;Monitoring;Glycemic Control    Special Needs  None    Preferred Learning Style  Auditory;Visual;Hands on    [place] in progress    How often do you need to have someone help you when you read instructions, pamphlets, or other written materials from your doctor or pharmacy?  1 - Never    What is the last grade level you completed in school?  Master's in [place]      Pre-Education Assessment   Patient understands the diabetes disease and treatment process.  Needs Instruction    Patient understands incorporating nutritional management into lifestyle.  Needs Instruction    Patient  undertands incorporating physical activity into lifestyle.  Needs Instruction    Patient understands using medications safely.  Needs Instruction    Patient understands monitoring blood glucose, interpreting and using results  Needs Instruction    Patient understands prevention, detection, and treatment of acute complications.  Needs Instruction    Patient understands prevention, detection, and treatment of chronic complications.  Needs Instruction    Patient understands how to develop strategies to address psychosocial issues.  Needs Instruction    Patient understands how to develop strategies to promote health/change behavior.  Needs Instruction      Complications   Last HgB A1C per patient/outside source  13 %    How often do you check your blood sugar?  > 4 times/day   Libre   Fasting Blood glucose range (mg/dL)  130-179    Postprandial Blood glucose range (mg/dL)  130-179;180-200    Number of hypoglycemic episodes per month  0    Have you had a dilated eye exam in the past 12 months?  No    Have you had a dental exam in the past 12 months?  No    Are you checking your feet?  Yes    How many days per week are you checking your feet?  7      Dietary Intake   Breakfast  skips OR  2 bacon, boiled egg OR grits OR omelet with toast and jelly    Lunch  4" sub occasionally with chips OR frozen entree with meat, starch and fruit OR salad with lean meat and [place] Salad OR    Snack (afternoon)  gold fish OR fruit cup OR    Dinner  [employer] or hot dog, fries OR [employer] OR fried chicken, mashed potatoes, OR home meal with meat, starch, vegetable and bread OR pizza    Snack (evening)  not after dinner    Beverage(s)  water, hot tea with Splenda, lemon and honey, flavored water,      Exercise   Exercise Type  Light (walking / raking leaves)    How many days per week to you exercise?  2.5    How many minutes per day do you exercise?  30    Total minutes per week of exercise  75       Patient Education   Previous Diabetes Education  Yes (please comment)   june, 2020   Disease state   Definition of diabetes, type 1 and 2, and the diagnosis of diabetes;Factors that contribute to the development of diabetes    Nutrition management   Role of diet in the treatment of diabetes and the relationship between the three main macronutrients and blood glucose level;Food label reading, portion sizes and measuring food.;Carbohydrate counting;Reviewed blood glucose goals for pre and post meals and how to evaluate the patients' food intake on their blood glucose level.    Physical activity and exercise   Role of exercise on diabetes management, blood pressure control and cardiac health.;Helped patient identify appropriate exercises in relation to his/her diabetes, diabetes complications and other health issue.    Medications  Reviewed patients medication for diabetes, action, purpose, timing of dose and side effects.    Monitoring  Identified appropriate SMBG and/or A1C goals.    Chronic complications  Relationship between chronic complications and blood glucose control    Psychosocial adjustment  Role of stress on diabetes      Individualized Goals (developed by patient)   Nutrition  Follow meal plan discussed    Physical Activity  Exercise 3-5 times per week    Medications  take my medication as prescribed    Monitoring   test blood glucose pre and post meals as discussed      Post-Education Assessment   Patient understands the diabetes disease and treatment process.  Demonstrates understanding / competency    Patient understands incorporating nutritional management into lifestyle.  Demonstrates understanding / competency    Patient undertands incorporating physical activity into lifestyle.  Demonstrates understanding / competency    Patient understands using medications safely.  Demonstrates understanding / competency    Patient understands monitoring blood glucose, interpreting and using  results  Demonstrates understanding / competency    Patient understands prevention, detection, and treatment of acute complications.  Demonstrates understanding / competency    Patient understands prevention, detection, and treatment of chronic complications.  Demonstrates understanding / competency    Patient understands how to develop strategies to address psychosocial issues.  Demonstrates understanding / competency    Patient understands how to develop strategies to promote health/change behavior.  Demonstrates understanding / competency      Outcomes   Expected Outcomes  Demonstrated interest in learning. Expect positive outcomes    Future DMSE  PRN    Program Status  Not Completed       Individualized Plan for Diabetes  Self-Management Training:   Learning Objective:  Patient will have a greater understanding of diabetes self-management. Patient education plan is to attend individual and/or group sessions per assessed needs and concerns.   Plan:   Patient Instructions  Plan:  Aim for 3 Carb Choices per meal (45 grams) +/- 1 either way  Aim for 0-2 Carbs per snack if hungry  Include protein in moderation with your meals and snacks Consider reading food labels for Total Carbohydrate of foods Consider  increasing your activity level by walking, dancing or other activities for 30 minutes a few days a week as tolerated Continue checking BG at alternate times per day with Elenor Legato Continue taking medication as directed by MD  Expected Outcomes:  Demonstrated interest in learning. Expect positive outcomes  Education material provided: Food label handouts, A1C conversion sheet, Meal plan card and Carbohydrate counting sheet, Diabetes Medication handout  If problems or questions, patient to contact team via:  Phone  Future DSME appointment: PRN

## 2019-12-03 ENCOUNTER — Other Ambulatory Visit: Payer: Self-pay | Admitting: Physician Assistant

## 2019-12-03 DIAGNOSIS — Z1231 Encounter for screening mammogram for malignant neoplasm of breast: Secondary | ICD-10-CM

## 2019-12-23 ENCOUNTER — Other Ambulatory Visit: Payer: Self-pay

## 2019-12-23 ENCOUNTER — Ambulatory Visit
Admission: RE | Admit: 2019-12-23 | Discharge: 2019-12-23 | Disposition: A | Discharge status: Home / Self Care | Payer: No Typology Code available for payment source | Source: Ambulatory Visit | Attending: Physician Assistant | Admitting: Physician Assistant

## 2019-12-23 DIAGNOSIS — Z1231 Encounter for screening mammogram for malignant neoplasm of breast: Secondary | ICD-10-CM

## 2020-01-09 ENCOUNTER — Encounter (HOSPITAL_COMMUNITY): Payer: Self-pay | Admitting: *Deleted

## 2020-01-09 ENCOUNTER — Emergency Department (HOSPITAL_COMMUNITY)
Admission: EM | Admit: 2020-01-09 | Discharge: 2020-01-16 | Disposition: E | Payer: No Typology Code available for payment source | Attending: Emergency Medicine | Admitting: Emergency Medicine

## 2020-01-09 DIAGNOSIS — I10 Essential (primary) hypertension: Secondary | ICD-10-CM | POA: Diagnosis not present

## 2020-01-09 DIAGNOSIS — E119 Type 2 diabetes mellitus without complications: Secondary | ICD-10-CM | POA: Diagnosis not present

## 2020-01-09 DIAGNOSIS — I469 Cardiac arrest, cause unspecified: Secondary | ICD-10-CM | POA: Diagnosis not present

## 2020-01-09 DIAGNOSIS — Z794 Long term (current) use of insulin: Secondary | ICD-10-CM | POA: Insufficient documentation

## 2020-01-09 DIAGNOSIS — Z79899 Other long term (current) drug therapy: Secondary | ICD-10-CM | POA: Insufficient documentation

## 2020-01-09 MED ORDER — SODIUM BICARBONATE 8.4 % IV SOLN
INTRAVENOUS | Status: AC | PRN
  Administered 2020-01-09: 50 meq via INTRAVENOUS

## 2020-01-09 MED ORDER — EPINEPHRINE 1 MG/10ML IJ SOSY
PREFILLED_SYRINGE | INTRAMUSCULAR | Status: AC | PRN
  Administered 2020-01-09: 1 via INTRAVENOUS

## 2020-01-09 NOTE — ED Triage Notes (Signed)
Pt seen sitting in car at 1200.  The car appeared to be leaving the parking lot .  When the car still hadn't moved by 1345 the security went to check on her and found her unresponsive, with "shallow breathing", but when fire arrived shortly there-after, pt was pulseless and apneic.  CPR initiated at 1352.  7 epis, 750 ns and 12.5 g D10 given for cbg of 40.    King airway present.    Lucas performing compressions.

## 2020-01-09 NOTE — ED Provider Notes (Addendum)
[facility] EMERGENCY DEPARTMENT Provider Note   CSN: [id number] Arrival date & time: 01/11/2020  1430     History Chief Complaint  Patient presents with  . Cardiac Arrest    Belinda Chan is a 54 y.o. female.  Patient is a 54 year old female with a history of diabetes, anemia, hypertension who is presenting today with paramedics status post arrest.  History was gained from EMS, by standards and patient's family.  Patient's daughter reports she was normal this morning and had no complaints.  Patient had told her father that she was getting a lot and get something to eat.  She was sitting in her car with it running when bystanders noticed she was slumped over the wheel of the car and not responding.  A bystander got her out of the car but no CPR was performed.  When fire arrived patient was in asystole and apneic.  A King airway was placed in CPR was initiated.  EMS reports that she has received 7 rounds of epinephrine, 1 amp of D50 as her blood sugar was 40 and ongoing CPR for the last 35 minutes with persistent asystole.  The history is provided by the EMS personnel. The history is limited by the condition of the patient.  Cardiac Arrest Witnessed by:  Not witnessed Incident location: in the car.      Past Medical History:  Diagnosis Date  . Anemia    in her 40's  . Arthritis    back,feet, right arm  . Blood transfusion without reported diagnosis    2010 hysterectomy  . Diabetes mellitus without complication ([place])   . Heart murmur   . Hypertension     There are no problems to display for this patient.   Past Surgical History:  Procedure Laterality Date  . back fusion  2013 October  . BUNIONECTOMY     2010  . CESAREAN SECTION     1997  . ELBOW SURGERY  10/2013  . hystertectomy     2010  . OSTEOTOMY     right calcaeus removal of screws, ( minimal degeneraative disease @ first metatersophalangeal joint  . ROTATOR CUFF REPAIR Left 10/22/2017  .  SHOULDER ARTHROSCOPY WITH BICEPS TENDON REPAIR Right 02/14/2018   Procedure: SHOULDER ARTHROSCOPY WITH BICEPS TENDON REPAIR;  Surgeon: Hiram Gash, MD;  Location: [facility];  Service: Orthopedics;  Laterality: Right;     OB History   No obstetric history on file.     Family History  Problem Relation Age of Onset  . Colon cancer Neg Hx   . Colon polyps Neg Hx   . Esophageal cancer Neg Hx   . Rectal cancer Neg Hx   . Stomach cancer Neg Hx     Social History   Tobacco Use  . Smoking status: Never Smoker  . Smokeless tobacco: Never Used  Vaping Use  . Vaping Use: Never used  Substance Use Topics  . Alcohol use: Never  . Drug use: Never    Home Medications Prior to Admission medications   Medication Sig Start Date End Date Taking? Authorizing Provider  amLODipine (NORVASC) 5 MG tablet Take 5 mg by mouth daily.  07/22/17   [provider]  insulin glargine (LANTUS) 100 UNIT/ML injection Inject 26 Units into the skin daily. Increase as needed by 2 units every 2 days up to 50 units until fasting blood sugars are within target ranges.    [provider]  losartan-hydrochlorothiazide Konrad Penta) 100-25  MG tablet Take 1 tablet by mouth daily.  07/22/17   [provider]  metFORMIN (GLUCOPHAGE) 500 MG tablet Take 500 mg by mouth 2 (two) times daily with a meal.    [provider]    Allergies    Patient has no known allergies.  Review of Systems   Review of Systems  Unable to perform ROS: Acuity of condition    Physical Exam Updated Vital Signs Pulse (!) 102   Resp (!) 63   Ht 5\' 4"  (1.626 m)   Wt 87 kg   SpO2 90%   BMI 32.92 kg/m   Physical Exam Constitutional:      Appearance: She is obese. She is toxic-appearing.  HENT:     Head: Normocephalic and atraumatic.     Mouth/Throat:     Mouth: Mucous membranes are dry.  Eyes:     Comments: Pupils are fixed at 4 mm and nonreactive.  Left pupil with subconjunctival  hemorrhage and proptosis  Cardiovascular:     Comments: No pulse no heartbeat Pulmonary:     Comments: King airway in place with clear breath sounds bilaterally oxygen saturation with Linton Rump going is 90% Abdominal:     General: Abdomen is flat.  Musculoskeletal:     Right lower leg: No edema.     Left lower leg: No edema.  Skin:    General: Skin is warm.  Neurological:     GCS: GCS eye subscore is 1. GCS verbal subscore is 1. GCS motor subscore is 1.  Psychiatric:     Comments: Unresponsive     ED Results / Procedures / Treatments   Labs (all labs ordered are listed, but only abnormal results are displayed) Labs Reviewed - No data to display  EKG None  Radiology No results found.  Procedures Procedures (including critical care time)  Medications Ordered in ED Medications  EPINEPHrine (ADRENALIN) 1 MG/10ML injection (1 Syringe Intravenous Given 01/14/2020 1429)  sodium bicarbonate injection (50 mEq Intravenous Given 12/31/2019 1429)    ED Course  I have reviewed the triage vital signs and the nursing notes.  Pertinent labs & imaging results that were available during my care of the patient were reviewed by me and considered in my medical decision making (see chart for details).    MDM Rules/Calculators/A&P                          Patient presenting today as a CPR in progress.  Patient had an unwitnessed arrest in the field with an unknown downtime.  Possibility that patient was in her car for up to 1 hour.  Patient has been asystolic throughout transport and upon arrival to the emergency room.  Patient has bilateral breath sounds with low suspicion for trauma or pneumothorax.  Patient's blood sugar here is 246.  Patient received a round of epi and bicarb.  However on pulse recheck patient remains asystolic.  Patient had been down for 45 minutes with an asystolic rhythm and time of death was called at 233.  Pt is not a medical examiner case.   Final Clinical Impression(s) /  ED Diagnoses Final diagnoses:  Cardiac arrest [facility])    Rx / DC Orders ED Discharge Orders    None       Blanchie Dessert, MD 01/04/2020 7017    Blanchie Dessert, MD 12/29/2019 [phone]

## 2020-01-09 NOTE — Progress Notes (Signed)
   01/01/2020 1430  Clinical Encounter Type  Visited With Family  Visit Type Death  Referral From Nurse  Consult/Referral To Chaplain  Spiritual Encounters  Spiritual Needs Grief support  Stress Factors  Family Stress Factors Loss  Chaplain visited with daughter and friend in consult room waiting for physician to come in and let them know that patient had died. Chaplain Brayam Boeke and Chaplain McMichael accompanied them to the purple room to view body and gave comfort. Chaplain Anesa Fronek gave them patient placement card.

## 2020-01-09 NOTE — Code Documentation (Signed)
CPR paused.  No pulse noted. Patient time of death occurred at 84

## 2020-01-16 DEATH — deceased

## 2021-12-07 IMAGING — MG DIGITAL SCREENING BILAT W/ TOMO W/ CAD
8 series · 8 of 24 positions shown · non-contrast
Comparison: Previous exam(s).

CLINICAL DATA: Screening.

EXAM:
DIGITAL SCREENING BILATERAL MAMMOGRAM WITH TOMO AND CAD

[R CC synth-2D]
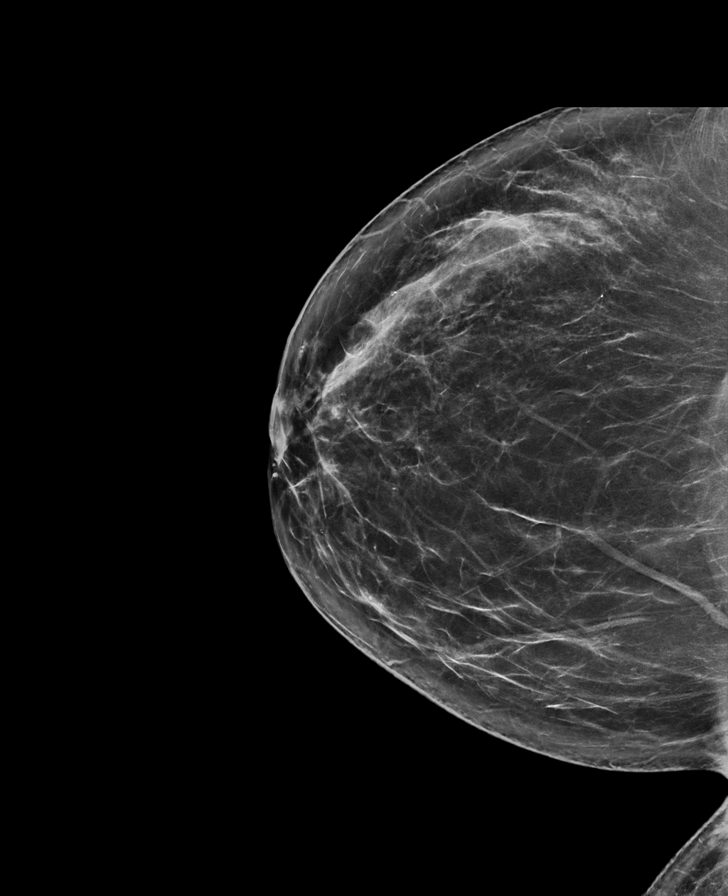

[R MLO synth-2D]
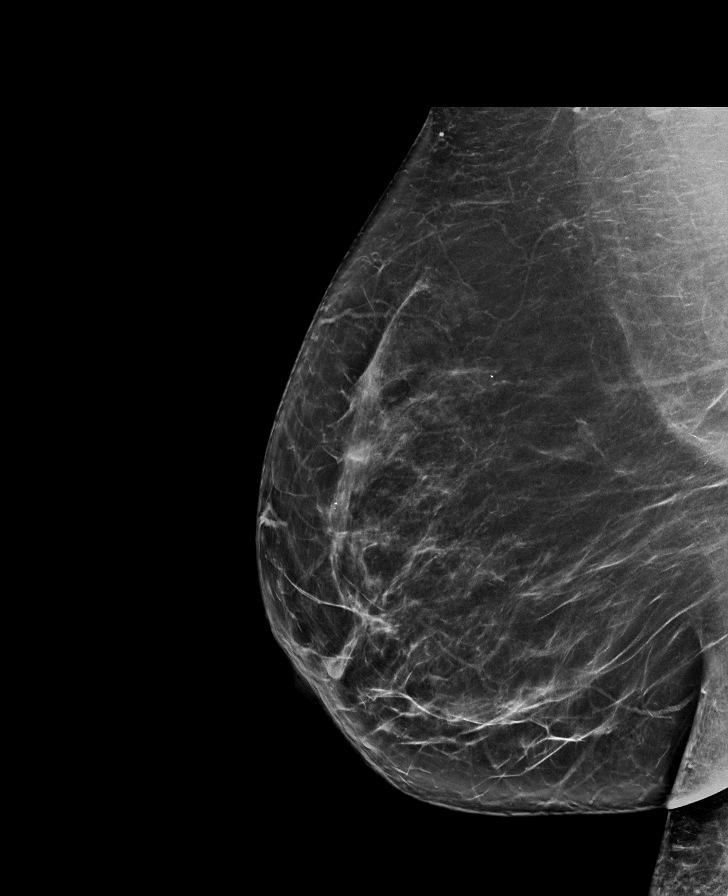

[L MLO synth-2D]
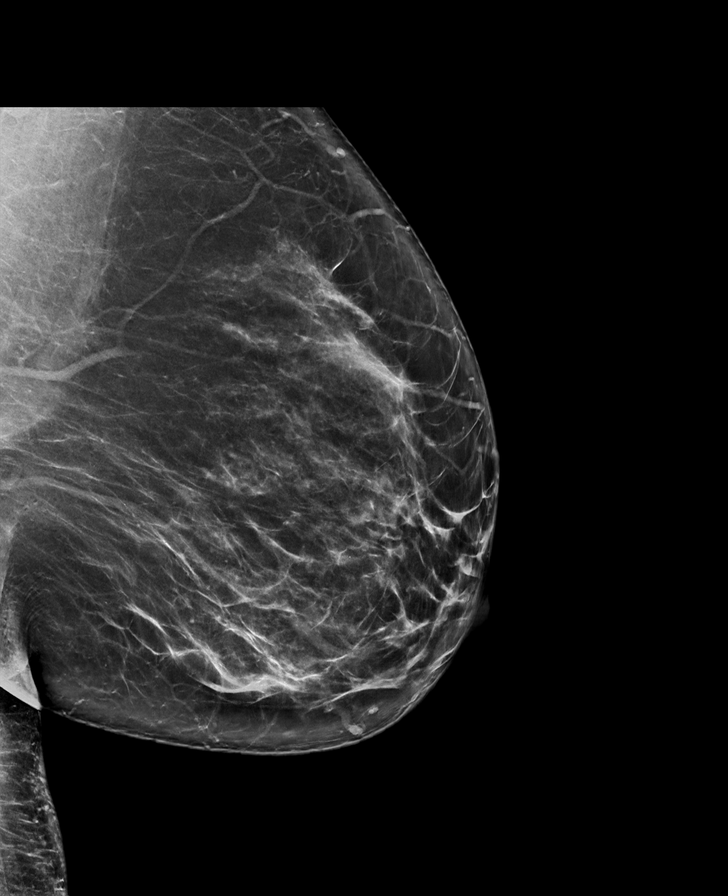

[L CC synth-2D]
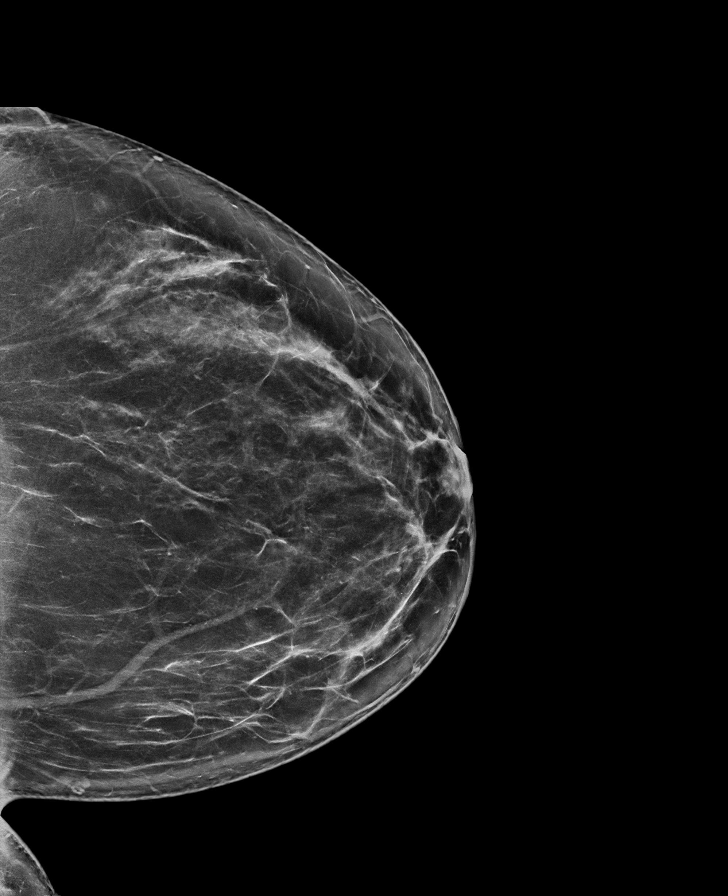

[R MLO tomo · tomo slice 45/88.0]
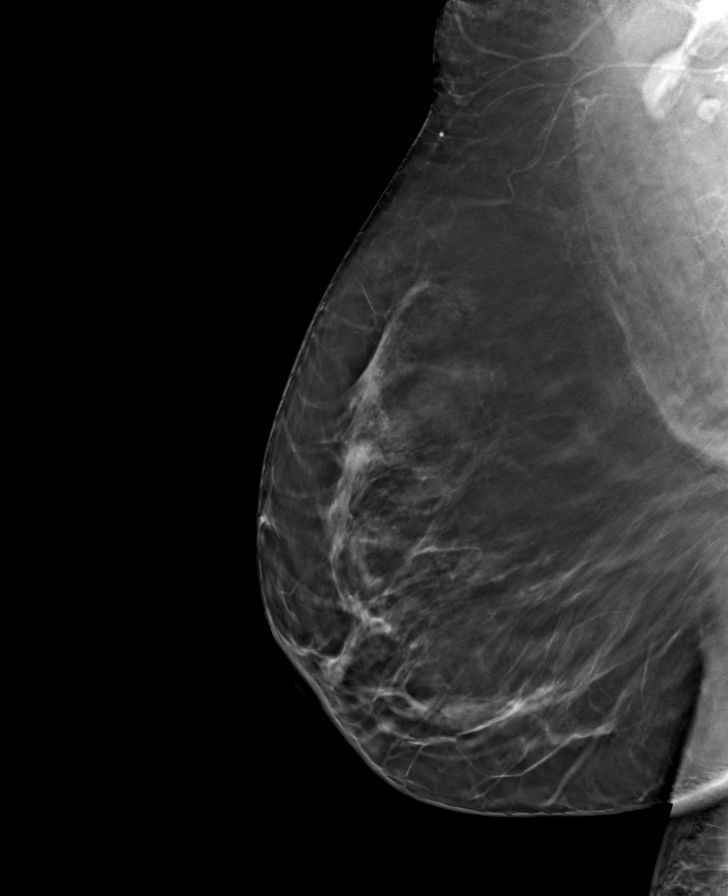

[L MLO tomo · tomo slice 45/89.0]
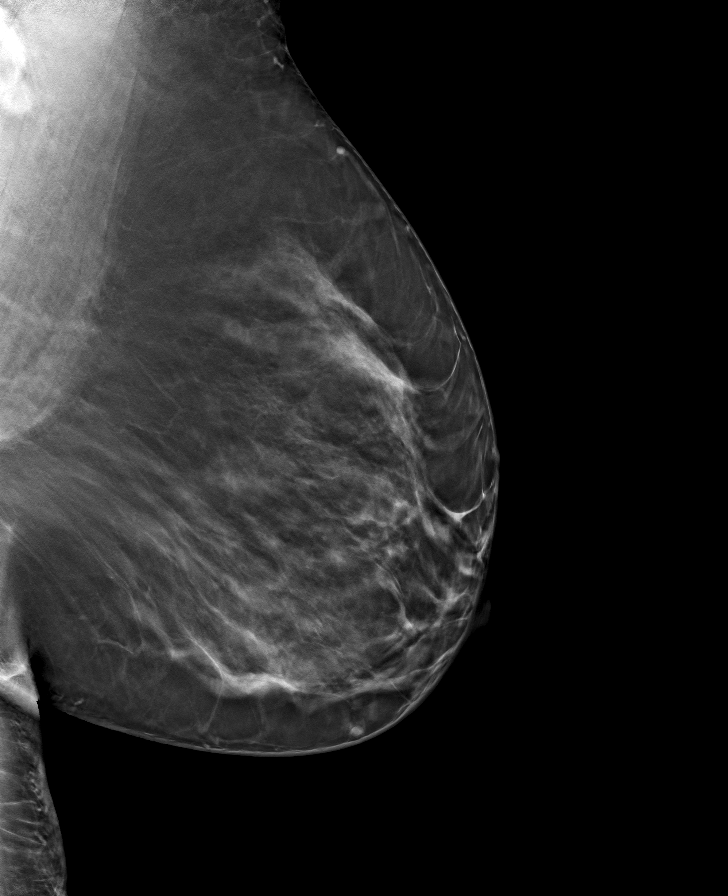

[L CC tomo · tomo slice 43/85.0]
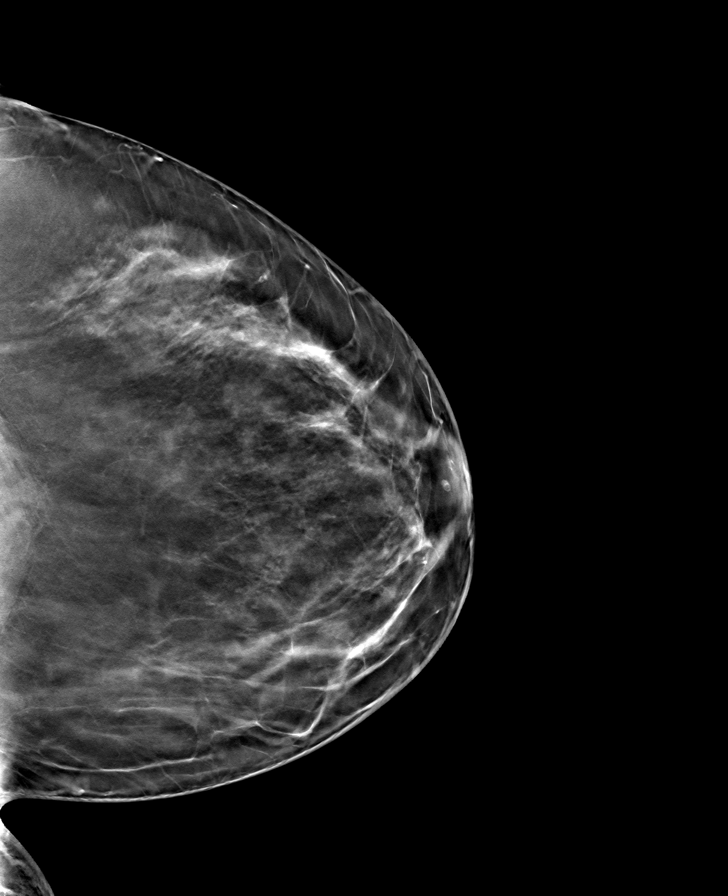

[R CC tomo · tomo slice 41/80.0]
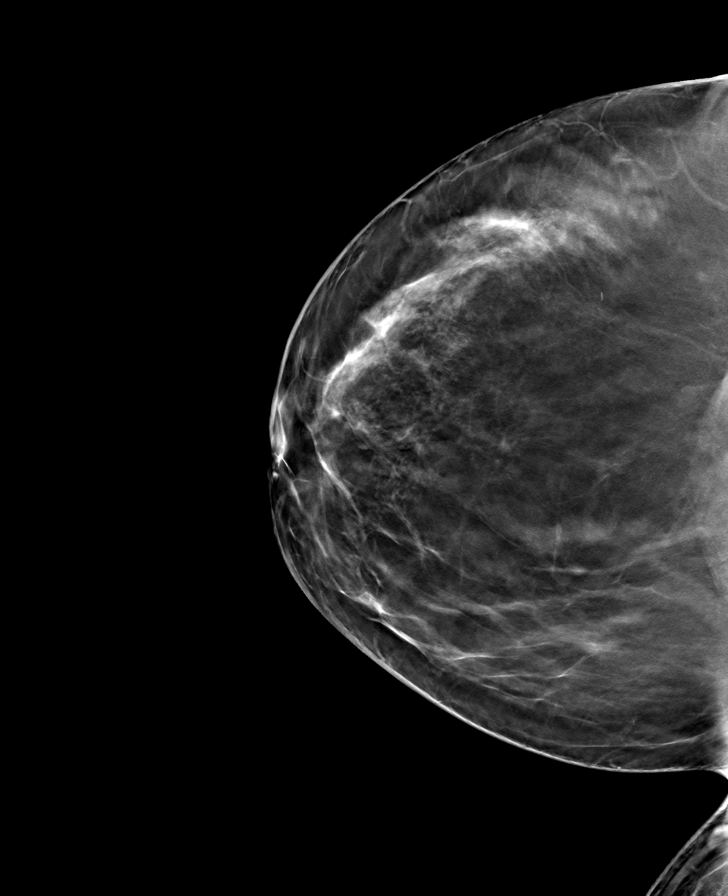

[8 of 24 positions shown; findings below may reference images not displayed]

ACR Breast Density Category b: There are scattered areas of
fibroglandular density.
FINDINGS: There are no findings suspicious for malignancy. Images were
processed with CAD.
IMPRESSION: No mammographic evidence of malignancy. A result letter of this
screening mammogram will be mailed directly to the patient.

RECOMMENDATION:
Screening mammogram in one year. (Code:[id number])

BI-RADS CATEGORY  1: Negative.
